# Patient Record
Sex: Male | Born: 1985 | Race: White | Hispanic: No | Smoking: Current every day smoker
Health system: Southern US, Community
[De-identification: ages and names within clinical notes are randomized; demographics above are authoritative.]

## PROBLEM LIST (undated history)

## (undated) DIAGNOSIS — J189 Pneumonia, unspecified organism: Secondary | ICD-10-CM

## (undated) HISTORY — PX: FINGER SURGERY: SHX640

---

## 2016-10-07 ENCOUNTER — Emergency Department (HOSPITAL_COMMUNITY)
Admission: EM | Admit: 2016-10-07 | Discharge: 2016-10-07 | Disposition: A | Discharge status: Home / Self Care | Payer: Self-pay | Attending: Emergency Medicine | Admitting: Emergency Medicine

## 2016-10-07 ENCOUNTER — Encounter (HOSPITAL_COMMUNITY): Payer: Self-pay | Admitting: Emergency Medicine

## 2016-10-07 ENCOUNTER — Emergency Department (HOSPITAL_COMMUNITY): Payer: Self-pay

## 2016-10-07 DIAGNOSIS — F17200 Nicotine dependence, unspecified, uncomplicated: Secondary | ICD-10-CM | POA: Insufficient documentation

## 2016-10-07 DIAGNOSIS — J189 Pneumonia, unspecified organism: Secondary | ICD-10-CM | POA: Insufficient documentation

## 2016-10-07 MED ORDER — AZITHROMYCIN 250 MG PO TABS
500.0000 mg | ORAL_TABLET | Freq: Once | ORAL | Status: AC
  Administered 2016-10-07: 500 mg via ORAL
  Filled 2016-10-07: qty 2

## 2016-10-07 MED ORDER — AZITHROMYCIN 250 MG PO TABS: 250.0000 mg | ORAL_TABLET | Freq: Every day | ORAL | 0 refills | Status: DC

## 2016-10-07 MED ORDER — GUAIFENESIN-CODEINE 100-10 MG/5ML PO SOLN: 5.0000 mL | Freq: Three times a day (TID) | ORAL | 0 refills | Status: DC | PRN

## 2016-10-07 NOTE — ED Provider Notes (Signed)
[facility]-EMERGENCY DEPT Provider Note   CSN: [id number] Arrival date & time: 10/07/16  1634  By signing my name below, I, Shawn Dunn, attest that this documentation has been prepared under the direction and in the presence of H&R BlockJeffrey Loron Weimer PA-C.  Electronically Signed: Vista Minkobert Dunn, ED Scribe. 10/07/16. 5:59 PM.   History   Chief Complaint Chief Complaint  Patient presents with  . Generalized Body Aches    HPI HPI Comments: Shawn Dunn is a 30 y.o. male who presents to the Emergency Department complaining of gradual onset generalized body aches, rhinorrhea, and chills, onset last night. Pt worked a long shift at work yesterday, came home and started to feel ill after getting out of the shower. He reports an oral temp of 100 that was taken last night and also states that he was extremely sweaty last night while trying to go to bed. He took Tylenol cold and flu last night with no significant relief. He also took a dose of Aleve at 1300 today. He also reports a mild headache and productive cough. Pt reports Hx of Pneumonia 5 years ago. No rash. No abdominal pain, nausea or vomiting.  The history is provided by the patient. No language interpreter was used.     History reviewed. No pertinent past medical history.  There are no active problems to display for this patient.   Past Surgical History:  Procedure Laterality Date  . FINGER SURGERY       Home Medications    Prior to Admission medications   Medication Sig Start Date End Date Taking? Authorizing Provider  azithromycin (ZITHROMAX) 250 MG tablet Take 1 tablet (250 mg total) by mouth daily. Please take one table daily for 4 days 10/07/16   Shawn MechanicJeffrey Pepe Mineau, PA-C  guaiFENesin-codeine 100-10 MG/5ML syrup Take 5 mLs by mouth 3 (three) times daily as needed for cough. 10/07/16   Shawn MechanicJeffrey Shawn Weatherholtz, PA-C    Family History No family history on file.  Social History Social History  Substance Use Topics  . Smoking status: Current  Every Day Smoker  . Smokeless tobacco: Never Used  . Alcohol use Yes    Allergies   Penicillins   Review of Systems Review of Systems A complete 10 system review of systems was obtained and all systems are negative except as noted in the HPI and PMH.    Physical Exam Updated Vital Signs BP 143/84 (BP Location: Left Arm)   Pulse 109   Temp 98.6 F (37 C) (Oral)   Resp 18   Ht 5\' 11"  (1.803 m)   Wt 175 lb (79.4 kg)   SpO2 98%   BMI 24.41 kg/m   Physical Exam  Constitutional: He is oriented to person, place, and time. He appears well-developed and well-nourished. No distress.  HENT:  Head: Normocephalic and atraumatic.  Right Ear: External ear normal.  Left Ear: External ear normal.  Nose: Rhinorrhea present.  Mouth/Throat: Oropharynx is clear and moist. No oropharyngeal exudate.  Neck: Normal range of motion.  Cardiovascular: Regular rhythm.   Pulmonary/Chest: Effort normal and breath sounds normal. No respiratory distress. He has no wheezes. He has no rales. He exhibits no tenderness.  Neurological: He is alert and oriented to person, place, and time.  Skin: Skin is warm and dry. He is not diaphoretic.  Psychiatric: He has a normal mood and affect. Judgment normal.  Nursing note and vitals reviewed.    ED Treatments / Results  DIAGNOSTIC STUDIES: Oxygen Saturation is 98% on RA, normal  by my interpretation.  COORDINATION OF CARE: 5:58 PM-Discussed treatment plan with pt at bedside and pt agreed to plan.   Labs (all labs ordered are listed, but only abnormal results are displayed) Labs Reviewed - No data to display  EKG  EKG Interpretation None       Radiology Dg Chest 2 View  Result Date: 10/07/2016 CLINICAL DATA:  Cough and fever. Rhinorrhea. Chills. Body aches. Headache. EXAM: CHEST  2 VIEW COMPARISON:  None. FINDINGS: Heart size is normal. Ill-defined right lower lobe airspace disease is evident. A small right effusion is suspected. The left lung is  clear. There is no edema. The visualized soft tissues and bony thorax are unremarkable. IMPRESSION: 1. Ill-defined right posterior lower lobe airspace disease concerning for early pneumonia. Electronically Signed   By: Shawn Dunn  Mattern M.D.   On: 10/07/2016 19:14    Procedures Procedures (including critical care time)  Medications Ordered in ED Medications  azithromycin (ZITHROMAX) tablet 500 mg (500 mg Oral Given 10/07/16 1939)     Initial Impression / Assessment and Plan / ED Course  I have reviewed the triage vital signs and the nursing notes.  Pertinent labs & imaging results that were available during my care of the patient were reviewed by me and considered in my medical decision making (see chart for details).  Clinical Course     30 year old male presents today with likely pneumonia. Patient reports symptoms seem similar to previous episodes of pneumonia, reports generalized fatigue. He denies any respiratory distress, is otherwise healthy with no chronic health conditions. He is afebrile and nontoxic-appearing. No recent antibiotic exposure. Patient will be started on azithromycin, he will be given symptomatic care instructions and strict Cautions. He verbalized understanding and agreement to today's plan had no further questions or concerns at time of discharge  Final Clinical Impressions(s) / ED Diagnoses   Final diagnoses:  Community acquired pneumonia, unspecified laterality    New Prescriptions Discharge Medication List as of 10/07/2016  7:28 PM    START taking these medications   Details  azithromycin (ZITHROMAX) 250 MG tablet Take 1 tablet (250 mg total) by mouth daily. Please take one table daily for 4 days, Starting Sat 10/07/2016, Print    guaiFENesin-codeine 100-10 MG/5ML syrup Take 5 mLs by mouth 3 (three) times daily as needed for cough., Starting Sat 10/07/2016, Print      I personally performed the services described in this documentation, which was  scribed in my presence. The recorded information has been reviewed and is accurate.   Shawn MechanicJeffrey Eldrige Pitkin, PA-C 10/07/16 2014    Donnetta HutchingBrian Cook, MD 10/13/16 [phone]

## 2016-10-07 NOTE — ED Triage Notes (Signed)
Pt c/o generalized not feeling well onset last night. States he took severe cold and flu medication with some relief. Took alieve about 1 pm today. Runny nose, chills, body aches and headache. Drinking lots of fluids.

## 2016-10-07 NOTE — Discharge Instructions (Signed)
Please read attached information. If you experience any new or worsening signs or symptoms please return to the emergency room for evaluation. Please follow-up with your primary care provider or specialist as discussed. Please use medication prescribed only as directed and discontinue taking if you have any concerning signs or symptoms.   °

## 2017-12-25 ENCOUNTER — Encounter (HOSPITAL_COMMUNITY): Payer: Self-pay | Admitting: Emergency Medicine

## 2017-12-25 ENCOUNTER — Other Ambulatory Visit: Payer: Self-pay

## 2017-12-25 ENCOUNTER — Emergency Department (HOSPITAL_COMMUNITY)
Admission: EM | Admit: 2017-12-25 | Discharge: 2017-12-26 | Disposition: A | Discharge status: Home / Self Care | Payer: Self-pay | Attending: Emergency Medicine | Admitting: Emergency Medicine

## 2017-12-25 DIAGNOSIS — K2920 Alcoholic gastritis without bleeding: Secondary | ICD-10-CM | POA: Insufficient documentation

## 2017-12-25 DIAGNOSIS — K625 Hemorrhage of anus and rectum: Secondary | ICD-10-CM | POA: Insufficient documentation

## 2017-12-25 DIAGNOSIS — F17210 Nicotine dependence, cigarettes, uncomplicated: Secondary | ICD-10-CM | POA: Insufficient documentation

## 2017-12-25 LAB — URINALYSIS, ROUTINE W REFLEX MICROSCOPIC
Bilirubin Urine: NEGATIVE
GLUCOSE, UA: NEGATIVE mg/dL
Hgb urine dipstick: NEGATIVE
Ketones, ur: 5 mg/dL — AB
Nitrite: NEGATIVE
PH: 7 (ref 5.0–8.0)
PROTEIN: 100 mg/dL — AB
SPECIFIC GRAVITY, URINE: 1.024 (ref 1.005–1.030)
Squamous Epithelial / LPF: NONE SEEN

## 2017-12-25 LAB — COMPREHENSIVE METABOLIC PANEL
ALBUMIN: 4.1 g/dL (ref 3.5–5.0)
ALK PHOS: 78 U/L (ref 38–126)
ALT: 22 U/L (ref 17–63)
AST: 27 U/L (ref 15–41)
Anion gap: 10 (ref 5–15)
BILIRUBIN TOTAL: 0.5 mg/dL (ref 0.3–1.2)
BUN: 10 mg/dL (ref 6–20)
CALCIUM: 9.3 mg/dL (ref 8.9–10.3)
CO2: 28 mmol/L (ref 22–32)
CREATININE: 0.86 mg/dL (ref 0.61–1.24)
Chloride: 100 mmol/L — ABNORMAL LOW (ref 101–111)
GFR calc Af Amer: >60 mL/min (ref >60)
GFR calc non Af Amer: >60 mL/min (ref >60)
GLUCOSE: 160 mg/dL — AB (ref 65–99)
Potassium: 4.1 mmol/L (ref 3.5–5.1)
Sodium: 138 mmol/L (ref 135–145)
TOTAL PROTEIN: 6.9 g/dL (ref 6.5–8.1)

## 2017-12-25 LAB — CBC
HCT: 44.7 % (ref 39.0–52.0)
Hemoglobin: 15.2 g/dL (ref 13.0–17.0)
MCH: 34.3 pg — ABNORMAL HIGH (ref 26.0–34.0)
MCHC: 34 g/dL (ref 30.0–36.0)
MCV: 100.9 fL — ABNORMAL HIGH (ref 78.0–100.0)
PLATELETS: 178 10*3/uL (ref 150–400)
RBC: 4.43 MIL/uL (ref 4.22–5.81)
RDW: 12.5 % (ref 11.5–15.5)
WBC: 9.1 10*3/uL (ref 4.0–10.5)

## 2017-12-25 LAB — POC OCCULT BLOOD, ED: Fecal Occult Bld: POSITIVE — AB

## 2017-12-25 LAB — LIPASE, BLOOD: Lipase: 24 U/L (ref 11–51)

## 2017-12-25 MED ORDER — LORAZEPAM 2 MG/ML IJ SOLN
0.5000 mg | Freq: Once | INTRAMUSCULAR | Status: DC
  Filled 2017-12-25: qty 1

## 2017-12-25 MED ORDER — SODIUM CHLORIDE 0.9 % IV BOLUS (SEPSIS): 1000.0000 mL | Freq: Once | INTRAVENOUS | Status: DC

## 2017-12-25 MED ORDER — GI COCKTAIL ~~LOC~~
30.0000 mL | Freq: Once | ORAL | Status: AC
  Administered 2017-12-25: 30 mL via ORAL
  Filled 2017-12-25: qty 30

## 2017-12-25 MED ORDER — SODIUM CHLORIDE 0.9 % IV BOLUS (SEPSIS)
1000.0000 mL | Freq: Once | INTRAVENOUS | Status: AC
  Administered 2017-12-25: 1000 mL via INTRAVENOUS

## 2017-12-25 MED ORDER — PANTOPRAZOLE SODIUM 40 MG IV SOLR
40.0000 mg | Freq: Once | INTRAVENOUS | Status: AC
  Administered 2017-12-25: 40 mg via INTRAVENOUS
  Filled 2017-12-25: qty 40

## 2017-12-25 NOTE — ED Provider Notes (Signed)
Patient placed in Quick Look pathway, seen and evaluated   Chief Complaint: abdominal pain  HPI:   Pt reports epigastric abdominal pain that began today. Denies nausea or vomiting. Able to eat normally. States has had one bowel movement that was soft with bright red blood. Reports dizziness and lightheadiness. Denies black or tarry stools. Denies fever, chills. Repots heavy alcohol use. Last drink last night.   ROS: Positive for abdominal pain, bright red blood per rectum, dizziness, anxiety, negative for rectal pain, nausea, vomiting.   Physical Exam:   Gen: appears anxious  Neuro: Awake and Alert  Skin: Warm    Focused Exam: Regular HR and rhythm, tachycardic, lungs clear to ausculation bilaterally. Abdomen soft, no guarding, no rigidity. TTP in epigastric area. Deferred rectal exam in triage.   Pt with epigastric pain, heavy drinker, appears to be very anxious. On exam, no guarding, no rigidity, no n/v. Differential includes gastritis, PUD, pancreatitis, colitis, gastroenteritis. Will get labs, administer fluids. Ordered GI cocktail. Ordered ativan to help with anxiety and possible alcohol withdrawal. HR currently 120s. Pt does not appear to be pale. He is otherwise in NAD.   Vitals:   12/25/17 1856 12/25/17 1937  BP: (!) 143/90   Pulse: (!) 120   Resp: 18   Temp: 98.4 F (36.9 C)   TempSrc: Oral   SpO2: 99%   Weight:  79.4 kg (175 lb)  Height:  5\' 11"  (1.803 m)     Initiation of care has begun. The patient has been counseled on the process, plan, and necessity for staying for the completion/evaluation, and the remainder of the medical screening examination    Jaynie CrumbleKirichenko, Diyari Cherne, Cordelia Poche-C 12/25/17 2006    Tegeler, Canary Brimhristopher J, MD 12/26/17 [phone]

## 2017-12-25 NOTE — ED Triage Notes (Signed)
Pt c/o 10/10 abd pain with some rectal bleed today. Pt denies any fever or chills, no nausea or vomiting.

## 2017-12-25 NOTE — ED Notes (Signed)
Per EDPA hold off on IV access or boluses at this time as well as previously ordered ativan from triage

## 2017-12-26 MED ORDER — OMEPRAZOLE 20 MG PO CPDR: 20.0000 mg | DELAYED_RELEASE_CAPSULE | Freq: Every day | ORAL | 0 refills | Status: AC

## 2017-12-26 MED ORDER — SUCRALFATE 1 G PO TABS: 1.0000 g | ORAL_TABLET | Freq: Three times a day (TID) | ORAL | 0 refills | Status: AC

## 2017-12-26 NOTE — ED Provider Notes (Signed)
[facility] EMERGENCY DEPARTMENT Provider Note   CSN: [id number] Arrival date & time: 12/25/17  1850     History   Chief Complaint Chief Complaint  Patient presents with  . Abdominal Pain  . Rectal Bleeding    HPI Shawn Dunn is a 32 y.o. male.  Patient presents with epigastric abdominal pain without nausea or vomiting that started this morning. He admits to heavy alcohol use last night and also "I drink more often than I should". He was able to eat and drink with difficulty today but reports feeling lightheaded and dizzy as the day progressed. No LOC, near syncope, diaphoresis. He states he had one bowel movement earlier today with BRB. No rectal pain. He states he had to moderately strain to pass the BM. No history of rectal bleeding, hemorrhoids or fissures. No melena.    The history is provided by the patient. No language interpreter was used.  Abdominal Pain   Associated symptoms include hematochezia. Pertinent negatives include fever, nausea and vomiting.  Rectal Bleeding  Associated symptoms: abdominal pain and light-headedness   Associated symptoms: no fever and no vomiting     History reviewed. No pertinent past medical history.  There are no active problems to display for this patient.   Past Surgical History:  Procedure Laterality Date  . FINGER SURGERY         Home Medications    Prior to Admission medications   Medication Sig Start Date End Date Taking? Authorizing Provider  ibuprofen (ADVIL) 200 MG tablet Take 400 mg by mouth every 6 (six) hours as needed for moderate pain.   Yes [provider]  azithromycin (ZITHROMAX) 250 MG tablet Take 1 tablet (250 mg total) by mouth daily. Please take one table daily for 4 days Patient not taking: Reported on 12/25/2017 10/07/16   Hedges, Tinnie Gens, PA-C  guaiFENesin-codeine 100-10 MG/5ML syrup Take 5 mLs by mouth 3 (three) times daily as needed for cough. Patient not taking: Reported on  12/25/2017 10/07/16   Eyvonne Mechanic, PA-C    Family History History reviewed. No pertinent family history.  Social History Social History   Tobacco Use  . Smoking status: Current Every Day Smoker    Packs/day: 1.00    Types: Cigarettes  . Smokeless tobacco: Never Used  Substance Use Topics  . Alcohol use: Yes  . Drug use: No     Allergies   Penicillins   Review of Systems Review of Systems  Constitutional: Negative for chills and fever.  Respiratory: Negative.  Negative for shortness of breath.   Cardiovascular: Negative.  Negative for chest pain.  Gastrointestinal: Positive for abdominal pain, anal bleeding and hematochezia. Negative for nausea, rectal pain and vomiting.  Musculoskeletal: Negative.   Skin: Negative.   Neurological: Positive for light-headedness. Negative for syncope.     Physical Exam Updated Vital Signs BP 133/82   Pulse 83   Temp 98.4 F (36.9 C) (Oral)   Resp 18   Ht 5\' 11"  (1.803 m)   Wt 79.4 kg (175 lb)   SpO2 94%   BMI 24.41 kg/m   Physical Exam  Constitutional: He appears well-developed and well-nourished.  HENT:  Head: Normocephalic.  Neck: Normal range of motion. Neck supple.  Cardiovascular: Normal rate and regular rhythm.  Pulmonary/Chest: Effort normal and breath sounds normal.  Abdominal: Soft. Bowel sounds are normal. There is tenderness (Mild tenderness without guarding.) in the epigastric area. There is no rebound and no guarding.  Genitourinary: Rectal  exam shows guaiac positive stool. Rectal exam shows no tenderness.  Genitourinary Comments: Stool is normal in color.   Musculoskeletal: Normal range of motion.  Neurological: He is alert. No cranial nerve deficit.  Skin: Skin is warm and dry. He is not diaphoretic.  Psychiatric: He has a normal mood and affect.     ED Treatments / Results  Labs (all labs ordered are listed, but only abnormal results are displayed) Labs Reviewed  COMPREHENSIVE METABOLIC PANEL -  Abnormal; Notable for the following components:      Result Value   Chloride 100 (*)    Glucose, Bld 160 (*)    All other components within normal limits  CBC - Abnormal; Notable for the following components:   MCV 100.9 (*)    MCH 34.3 (*)    All other components within normal limits  URINALYSIS, ROUTINE W REFLEX MICROSCOPIC - Abnormal; Notable for the following components:   Color, Urine AMBER (*)    APPearance CLOUDY (*)    Ketones, ur 5 (*)    Protein, ur 100 (*)    Leukocytes, UA MODERATE (*)    Bacteria, UA RARE (*)    All other components within normal limits  POC OCCULT BLOOD, ED - Abnormal; Notable for the following components:   Fecal Occult Bld POSITIVE (*)    All other components within normal limits  LIPASE, BLOOD    EKG  EKG Interpretation None       Radiology No results found.  Procedures Procedures (including critical care time)  Medications Ordered in ED Medications  gi cocktail (Maalox,Lidocaine,Donnatal) (30 mLs Oral Given 12/25/17 2225)  sodium chloride 0.9 % bolus 1,000 mL (1,000 mLs Intravenous New Bag/Given 12/25/17 2349)  pantoprazole (PROTONIX) injection 40 mg (40 mg Intravenous Given 12/25/17 2349)     Initial Impression / Assessment and Plan / ED Course  I have reviewed the triage vital signs and the nursing notes.  Pertinent labs & imaging results that were available during my care of the patient were reviewed by me and considered in my medical decision making (see chart for details).     Patient presents with epigastric abdominal pain and rectal bleeding. No vomiting. Heavy alcohol use last night. BRB on BM x 1 today only. No rectal pain.  GI cocktail with only minimal relief of epigastric discomfort. Heart rate is normal at rest and becomes tachycardic with any change in position. IVF's given as ordered on first assessment. IV protonix given.   On reasessment, he is feeling better. Guaiac positive, normal hemoglobin. No evidence  pancreatitis or bleeding UGI source. Epigastric discomfort is felt likely to alcohol overuse last night. Will treat with Prilosec and Carafate at home and refer to GI for recheck and further evaluation of rectal bleeding.   Final Clinical Impressions(s) / ED Diagnoses   Final diagnoses:  None   1. Gastritis 2. Rectal bleeding  ED Discharge Orders    None       Elpidio AnisUpstill, Waver Dibiasio, PA-C 12/26/17 0114    Shon BatonHorton, Courtney F, MD 12/26/17 0630

## 2018-01-05 IMAGING — CR DG CHEST 2V
2 series · 2 of 2 positions shown · non-contrast
Comparison: None.

CLINICAL DATA: Cough and fever. Rhinorrhea. Chills. Body aches.
Headache.

EXAM:
CHEST  2 VIEW

[w chest pa]
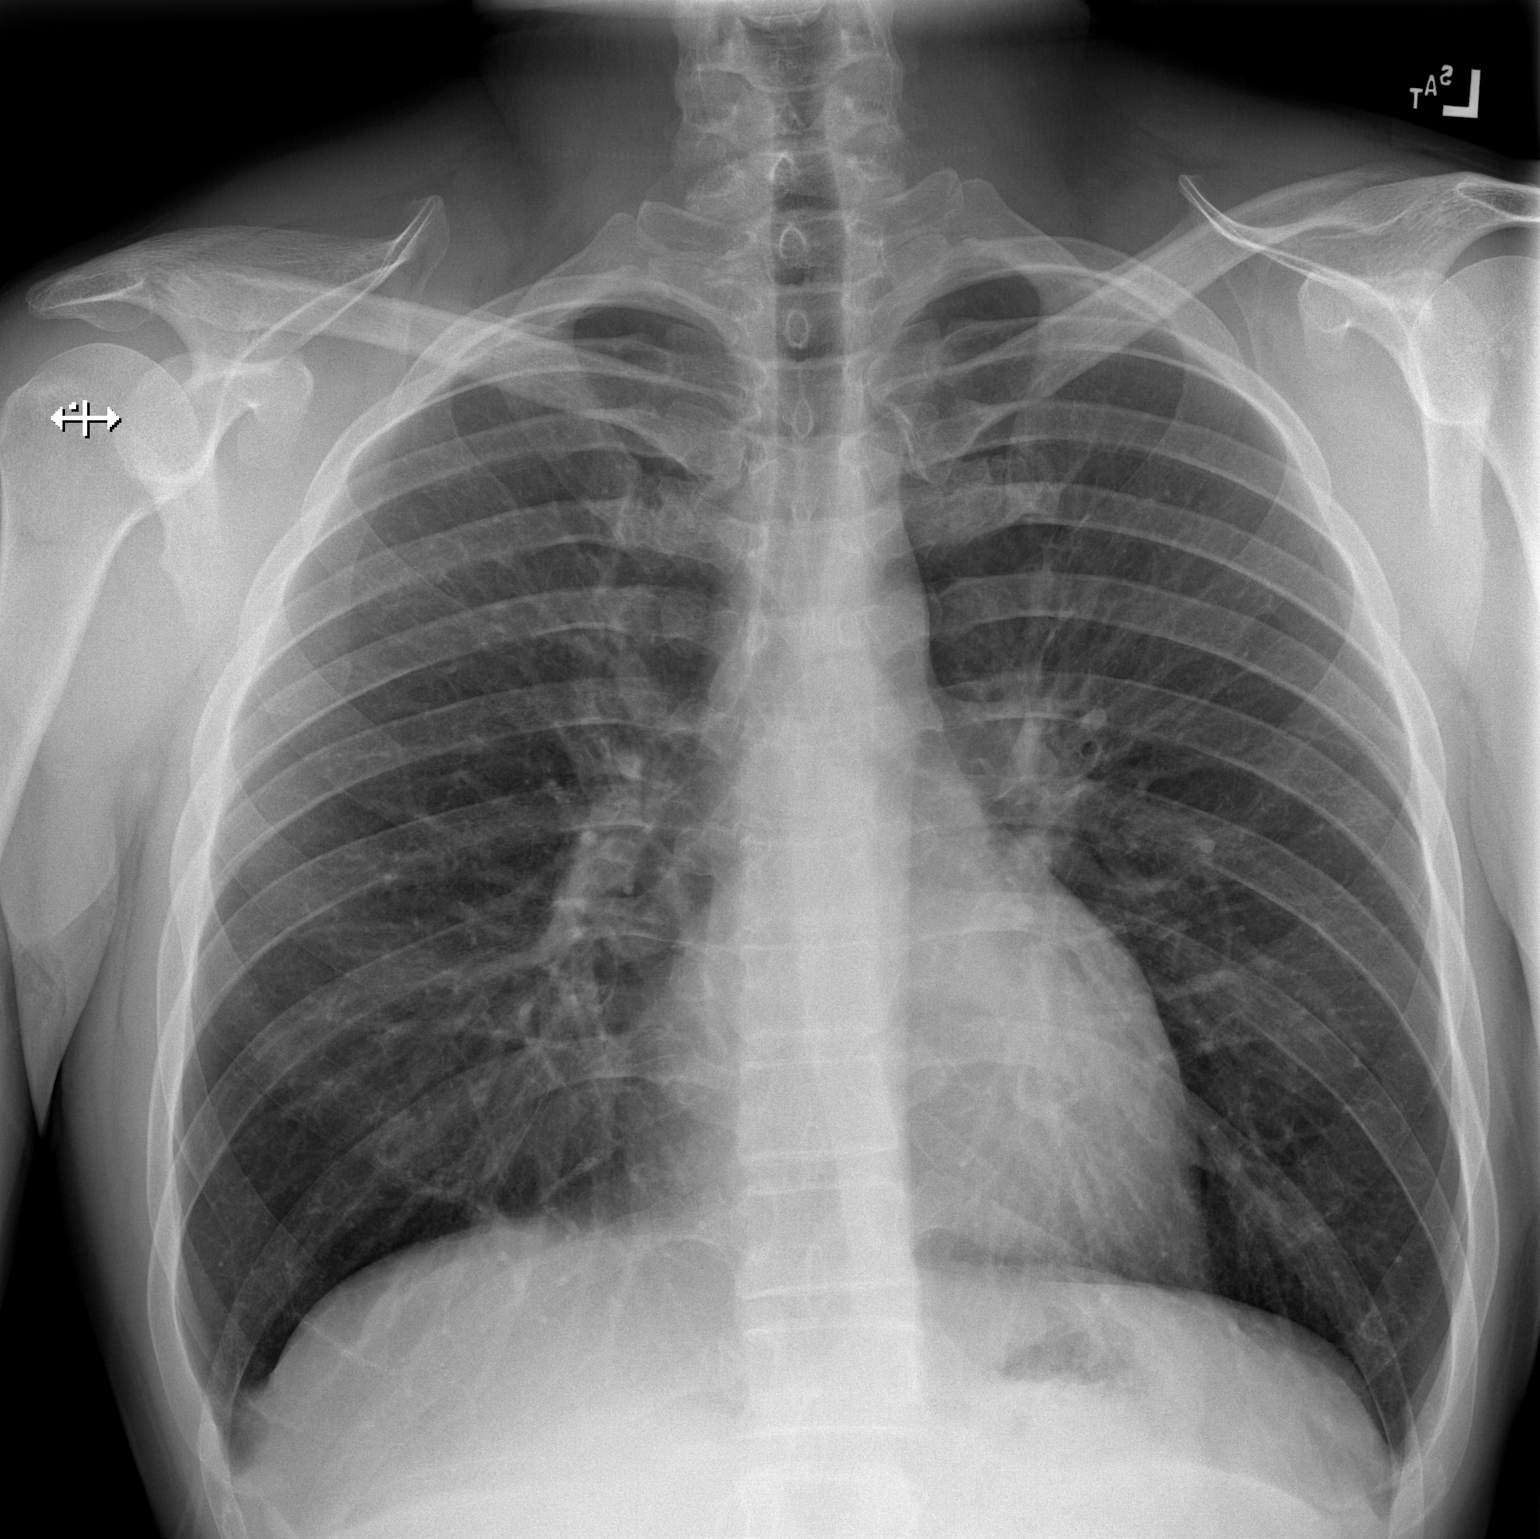

[w chest lat]
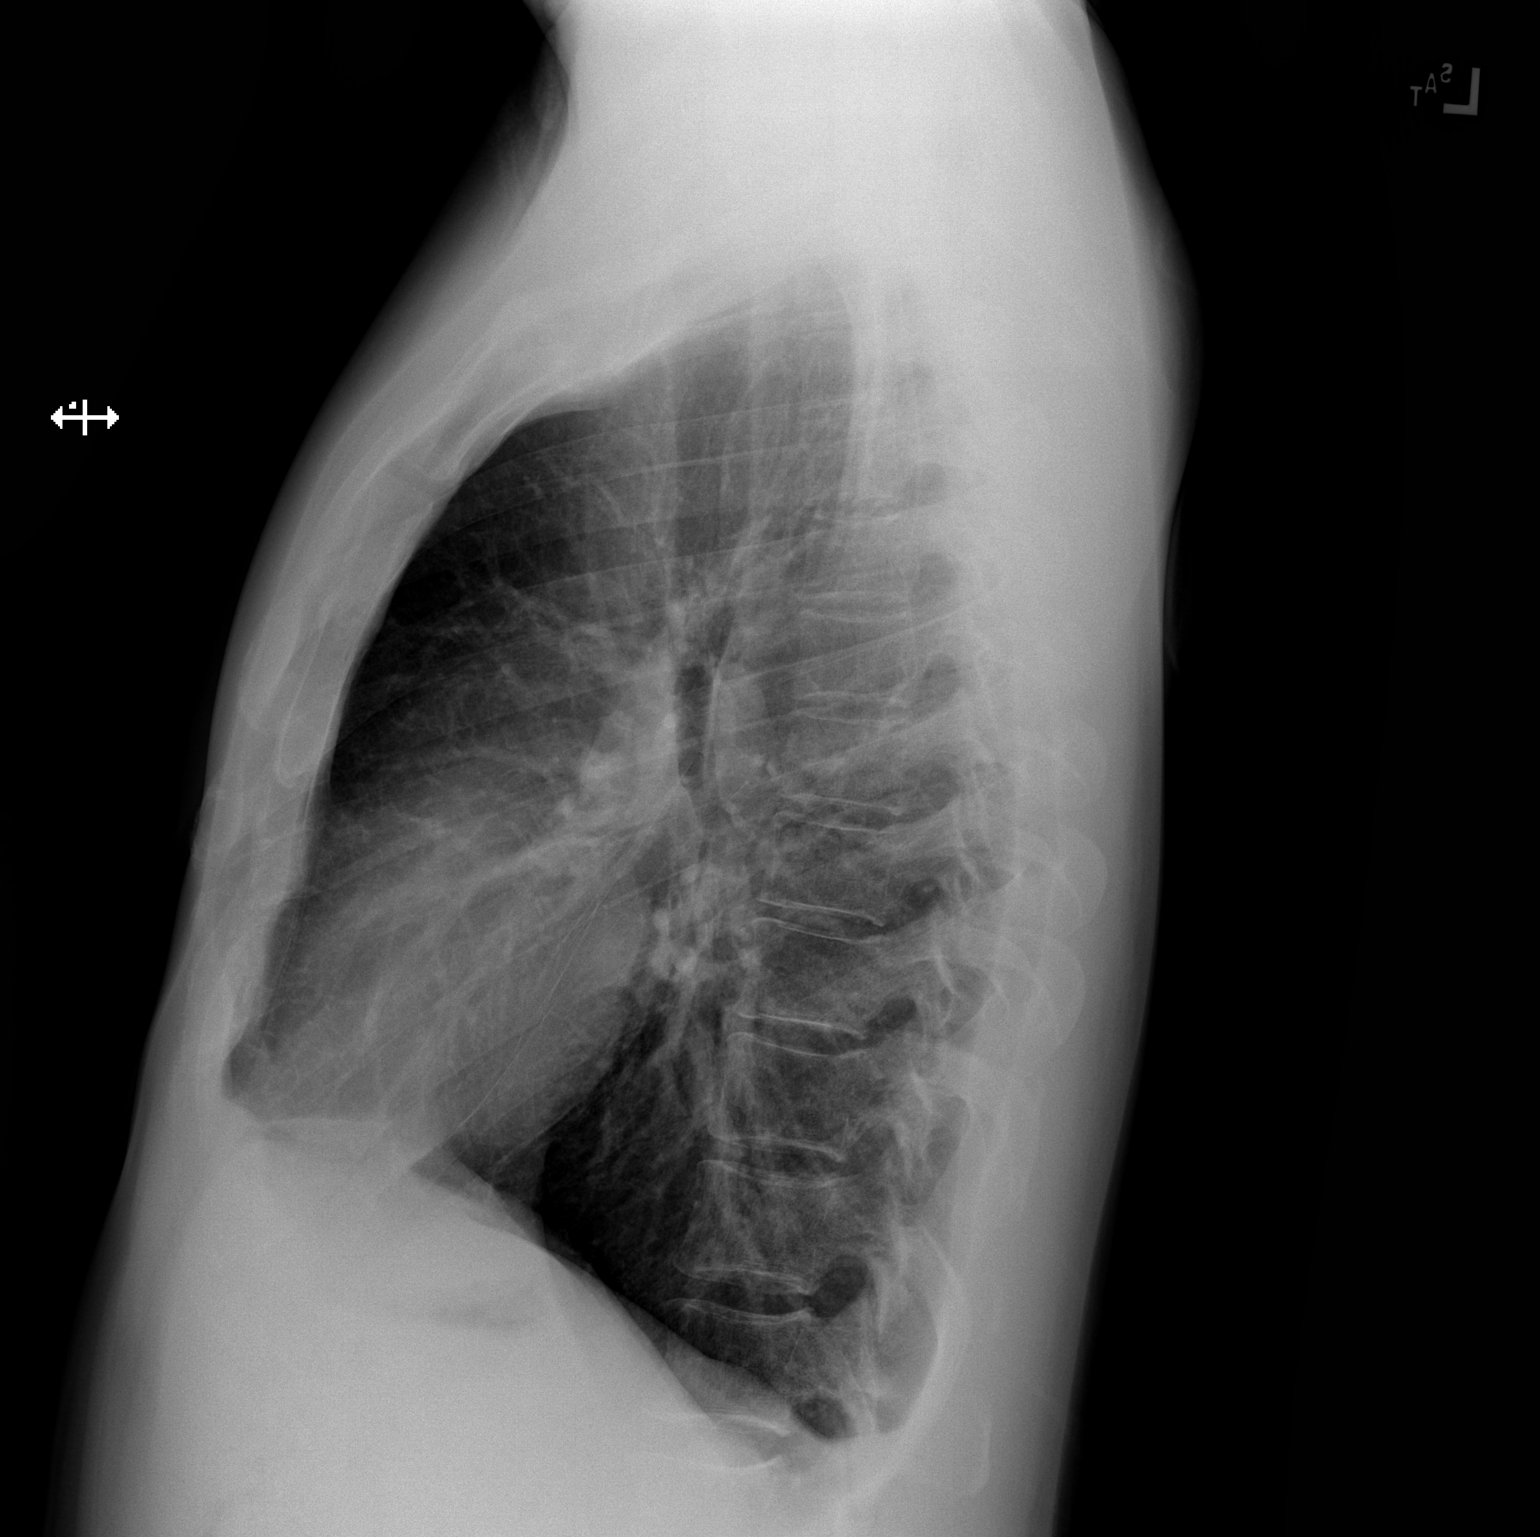

[2 of 2 positions shown; findings below may reference images not displayed]

FINDINGS: Heart size is normal. Ill-defined right lower lobe airspace disease
is evident. A small right effusion is suspected. The left lung is
clear. There is no edema. The visualized soft tissues and bony
thorax are unremarkable.
IMPRESSION: 1. Ill-defined right posterior lower lobe airspace disease
concerning for early pneumonia.

## 2018-10-13 ENCOUNTER — Ambulatory Visit (HOSPITAL_COMMUNITY)
Admission: EM | Admit: 2018-10-13 | Discharge: 2018-10-13 | Disposition: A | Discharge status: Home / Self Care | Payer: Self-pay | Attending: Internal Medicine | Admitting: Internal Medicine

## 2018-10-13 ENCOUNTER — Other Ambulatory Visit: Payer: Self-pay

## 2018-10-13 ENCOUNTER — Encounter (HOSPITAL_COMMUNITY): Payer: Self-pay | Admitting: *Deleted

## 2018-10-13 DIAGNOSIS — J111 Influenza due to unidentified influenza virus with other respiratory manifestations: Secondary | ICD-10-CM | POA: Insufficient documentation

## 2018-10-13 DIAGNOSIS — M7918 Myalgia, other site: Secondary | ICD-10-CM

## 2018-10-13 DIAGNOSIS — Z79899 Other long term (current) drug therapy: Secondary | ICD-10-CM | POA: Insufficient documentation

## 2018-10-13 DIAGNOSIS — R69 Illness, unspecified: Secondary | ICD-10-CM

## 2018-10-13 DIAGNOSIS — R112 Nausea with vomiting, unspecified: Secondary | ICD-10-CM

## 2018-10-13 DIAGNOSIS — F17210 Nicotine dependence, cigarettes, uncomplicated: Secondary | ICD-10-CM | POA: Insufficient documentation

## 2018-10-13 DIAGNOSIS — R509 Fever, unspecified: Secondary | ICD-10-CM

## 2018-10-13 DIAGNOSIS — Z880 Allergy status to penicillin: Secondary | ICD-10-CM | POA: Insufficient documentation

## 2018-10-13 HISTORY — DX: Pneumonia, unspecified organism: J18.9

## 2018-10-13 LAB — POCT RAPID STREP A: STREPTOCOCCUS, GROUP A SCREEN (DIRECT): NEGATIVE

## 2018-10-13 MED ORDER — ACETAMINOPHEN 325 MG PO TABS
975.0000 mg | ORAL_TABLET | Freq: Once | ORAL | Status: AC
  Administered 2018-10-13: 975 mg via ORAL

## 2018-10-13 MED ORDER — OSELTAMIVIR PHOSPHATE 75 MG PO CAPS: 75.0000 mg | ORAL_CAPSULE | Freq: Two times a day (BID) | ORAL | 0 refills | Status: AC

## 2018-10-13 MED ORDER — ACETAMINOPHEN 325 MG PO TABS
ORAL_TABLET | ORAL | Status: AC
  Filled 2018-10-13: qty 3

## 2018-10-13 NOTE — ED Triage Notes (Signed)
C/O high fever x 2 days with generalized body aches, HA and slight cough.  Had one episode vomiting.

## 2018-10-13 NOTE — ED Provider Notes (Signed)
[facility]    CSN: [id number] Arrival date & time: 10/13/18  1254     History   Chief Complaint Chief Complaint  Patient presents with  . Fever  . Cough    HPI Shawn Dunn is a 32 y.o. male.   32 yo male with no chronic medical problems complains of fever, nausea and vomiting.  Emesis has been NBNB.  Admits to body aches x3 days.      Past Medical History:  Diagnosis Date  . Pneumonia     There are no active problems to display for this patient.   Past Surgical History:  Procedure Laterality Date  . FINGER SURGERY         Home Medications    Prior to Admission medications   Medication Sig Start Date End Date Taking? Authorizing Provider  Acetaminophen (TYLENOL PO) Take by mouth.   Yes [provider]  UNKNOWN TO PATIENT OTC cold and flu med   Yes [provider]  ibuprofen (ADVIL) 200 MG tablet Take 400 mg by mouth every 6 (six) hours as needed for moderate pain.    [provider]  omeprazole (PRILOSEC) 20 MG capsule Take 1 capsule (20 mg total) by mouth daily for 5 days. 12/26/17 12/31/17  Elpidio AnisUpstill, Shari, PA-C  oseltamivir (TAMIFLU) 75 MG capsule Take 1 capsule (75 mg total) by mouth every 12 (twelve) hours. 10/13/18   Arnaldo Nataliamond, Merion Caton S, MD  sucralfate (CARAFATE) 1 g tablet Take 1 tablet (1 g total) by mouth 4 (four) times daily -  with meals and at bedtime for 5 days. 12/26/17 12/31/17  Elpidio AnisUpstill, Shari, PA-C    Family History History reviewed. No pertinent family history.  Social History Social History   Tobacco Use  . Smoking status: Current Every Day Smoker    Packs/day: 1.00    Types: Cigarettes  . Smokeless tobacco: Never Used  Substance Use Topics  . Alcohol use: Yes    Comment: rarely  . Drug use: Yes    Types: Marijuana     Allergies   Penicillins   Review of Systems Review of Systems  Constitutional: Positive for fever. Negative for chills.  HENT: Negative for sore throat and tinnitus.     Eyes: Negative for redness.  Respiratory: Positive for cough. Negative for shortness of breath.   Cardiovascular: Negative for chest pain and palpitations.  Gastrointestinal: Negative for abdominal pain, diarrhea, nausea and vomiting.  Genitourinary: Negative for dysuria, frequency and urgency.  Musculoskeletal: Negative for myalgias.  Skin: Negative for rash.       No lesions  Neurological: Negative for weakness.  Hematological: Does not bruise/bleed easily.  Psychiatric/Behavioral: Negative for suicidal ideas.     Physical Exam Triage Vital Signs ED Triage Vitals  Enc Vitals Group     BP 10/13/18 1447 137/78     Pulse Rate 10/13/18 1447 (!) 116     Resp 10/13/18 1447 16     Temp 10/13/18 1447 100 F (37.8 C)     Temp Source 10/13/18 1447 Oral     SpO2 10/13/18 1447 95 %     Weight --      Height --      Head Circumference --      Peak Flow --      Pain Score 10/13/18 1504 7     Pain Loc --      Pain Edu? --      Excl. in GC? --    No  data found.  Updated Vital Signs BP 137/78 (BP Location: Right Arm)   Pulse (!) 116   Temp (!) 102.5 F (39.2 C) (Temporal)   Resp 16   SpO2 95%   Visual Acuity Right Eye Distance:   Left Eye Distance:   Bilateral Distance:    Right Eye Near:   Left Eye Near:    Bilateral Near:     Physical Exam Constitutional:      General: He is not in acute distress.    Appearance: He is well-developed.  HENT:     Head: Normocephalic and atraumatic.     Mouth/Throat:     Pharynx: Pharyngeal swelling and posterior oropharyngeal erythema present.  Eyes:     General: No scleral icterus.    Conjunctiva/sclera: Conjunctivae normal.     Pupils: Pupils are equal, round, and reactive to light.  Neck:     Musculoskeletal: Normal range of motion and neck supple.     Thyroid: No thyromegaly.     Vascular: No JVD.     Trachea: No tracheal deviation.  Cardiovascular:     Rate and Rhythm: Normal rate and regular rhythm.     Heart sounds:  Normal heart sounds. No murmur. No friction rub. No gallop.   Pulmonary:     Effort: Pulmonary effort is normal. No respiratory distress.     Breath sounds: Normal breath sounds.  Abdominal:     General: Bowel sounds are normal. There is no distension.     Palpations: Abdomen is soft.     Tenderness: There is no abdominal tenderness.  Musculoskeletal: Normal range of motion.  Lymphadenopathy:     Cervical: No cervical adenopathy.  Skin:    General: Skin is warm and dry.     Findings: No erythema or rash.  Neurological:     Mental Status: He is alert and oriented to person, place, and time.     Cranial Nerves: No cranial nerve deficit.  Psychiatric:        Behavior: Behavior normal.        Thought Content: Thought content normal.        Judgment: Judgment normal.      UC Treatments / Results  Labs (all labs ordered are listed, but only abnormal results are displayed) Labs Reviewed  CULTURE, GROUP A STREP [facility]  POCT RAPID STREP A    EKG None  Radiology No results found.  Procedures Procedures (including critical care time)  Medications Ordered in UC Medications  acetaminophen (TYLENOL) tablet 975 mg (975 mg Oral Given 10/13/18 1509)    Initial Impression / Assessment and Plan / UC Course  I have reviewed the triage vital signs and the nursing notes.  Pertinent labs & imaging results that were available during my care of the patient were reviewed by me and considered in my medical decision making (see chart for details).     Very erthematous throat but patient denies significant pain. Rapid strep negative. Will treat for flu.   Final Clinical Impressions(s) / UC Diagnoses   Final diagnoses:  Influenza-like illness   Discharge Instructions   None    ED Prescriptions    Medication Sig Dispense Auth. Provider   oseltamivir (TAMIFLU) 75 MG capsule Take 1 capsule (75 mg total) by mouth every 12 (twelve) hours. 10 capsule Arnaldo Nataliamond, Anab Vivar S, MD      Controlled Substance Prescriptions [place] Controlled Substance Registry consulted? Not Applicable   Arnaldo Nataliamond, Reda Citron S, MD 10/13/18 [phone]

## 2018-10-15 LAB — CULTURE, GROUP A STREP (THRC)

## 2018-10-17 ENCOUNTER — Telehealth (HOSPITAL_COMMUNITY): Payer: Self-pay | Admitting: Emergency Medicine

## 2018-10-17 NOTE — Telephone Encounter (Signed)
Culture is positive for non group A Strep germ.  This is a finding of uncertain significance; not the typical 'strep throat' germ. LVMM
